# Patient Record
Sex: Female | Born: 1995 | Race: Black or African American | Hispanic: No | Marital: Single | Smoking: Never smoker
Health system: Southern US, Community
[De-identification: ages and names within clinical notes are randomized; demographics above are authoritative.]

## PROBLEM LIST (undated history)

## (undated) ENCOUNTER — Inpatient Hospital Stay (HOSPITAL_COMMUNITY): Payer: Self-pay

---

## 2018-04-14 ENCOUNTER — Inpatient Hospital Stay (HOSPITAL_COMMUNITY): Payer: Managed Care, Other (non HMO)

## 2018-04-14 ENCOUNTER — Inpatient Hospital Stay (HOSPITAL_COMMUNITY)
Admission: AD | Admit: 2018-04-14 | Discharge: 2018-04-14 | Disposition: A | Discharge status: Home / Self Care | Payer: Managed Care, Other (non HMO) | Source: Ambulatory Visit | Attending: Obstetrics & Gynecology | Admitting: Obstetrics & Gynecology

## 2018-04-14 ENCOUNTER — Encounter (HOSPITAL_COMMUNITY): Payer: Self-pay | Admitting: *Deleted

## 2018-04-14 DIAGNOSIS — O26891 Other specified pregnancy related conditions, first trimester: Secondary | ICD-10-CM | POA: Diagnosis not present

## 2018-04-14 DIAGNOSIS — O3680X0 Pregnancy with inconclusive fetal viability, not applicable or unspecified: Secondary | ICD-10-CM | POA: Diagnosis not present

## 2018-04-14 DIAGNOSIS — Z3A01 Less than 8 weeks gestation of pregnancy: Secondary | ICD-10-CM | POA: Insufficient documentation

## 2018-04-14 DIAGNOSIS — R109 Unspecified abdominal pain: Secondary | ICD-10-CM | POA: Diagnosis not present

## 2018-04-14 DIAGNOSIS — O209 Hemorrhage in early pregnancy, unspecified: Secondary | ICD-10-CM

## 2018-04-14 DIAGNOSIS — O26899 Other specified pregnancy related conditions, unspecified trimester: Secondary | ICD-10-CM

## 2018-04-14 LAB — CBC WITH DIFFERENTIAL/PLATELET
Basophils Absolute: 0 10*3/uL (ref 0.0–0.1)
Basophils Relative: 0 %
EOS ABS: 0.1 10*3/uL (ref 0.0–0.5)
EOS PCT: 1 %
HEMATOCRIT: 39.4 % (ref 36.0–46.0)
Hemoglobin: 13.2 g/dL (ref 12.0–15.0)
Lymphocytes Relative: 38 %
Lymphs Abs: 1.9 10*3/uL (ref 0.7–4.0)
MCH: 30.3 pg (ref 26.0–34.0)
MCHC: 33.5 g/dL (ref 30.0–36.0)
MCV: 90.4 fL (ref 80.0–100.0)
MONO ABS: 0.3 10*3/uL (ref 0.1–1.0)
MONOS PCT: 6 %
NEUTROS ABS: 2.9 10*3/uL (ref 1.7–7.7)
Neutrophils Relative %: 55 %
Platelets: 217 10*3/uL (ref 150–400)
RBC: 4.36 MIL/uL (ref 3.87–5.11)
RDW: 12.4 % (ref 11.5–15.5)
WBC: 5.2 10*3/uL (ref 4.0–10.5)
nRBC: 0 % (ref 0.0–0.2)

## 2018-04-14 LAB — HCG, QUANTITATIVE, PREGNANCY: hCG, Beta Chain, Quant, S: 261 m[IU]/mL — ABNORMAL HIGH (ref <5)

## 2018-04-14 LAB — COMPREHENSIVE METABOLIC PANEL
ALT: 11 U/L (ref 0–44)
ANION GAP: 8 (ref 5–15)
AST: 18 U/L (ref 15–41)
Albumin: 4.2 g/dL (ref 3.5–5.0)
Alkaline Phosphatase: 49 U/L (ref 38–126)
BILIRUBIN TOTAL: 1.2 mg/dL (ref 0.3–1.2)
BUN: 7 mg/dL (ref 6–20)
CO2: 25 mmol/L (ref 22–32)
Calcium: 9.1 mg/dL (ref 8.9–10.3)
Chloride: 106 mmol/L (ref 98–111)
Creatinine, Ser: 0.66 mg/dL (ref 0.44–1.00)
GFR calc Af Amer: >60 mL/min (ref >60)
GFR calc non Af Amer: >60 mL/min (ref >60)
GLUCOSE: 83 mg/dL (ref 70–99)
POTASSIUM: 3.7 mmol/L (ref 3.5–5.1)
Sodium: 139 mmol/L (ref 135–145)
TOTAL PROTEIN: 7.4 g/dL (ref 6.5–8.1)

## 2018-04-14 LAB — URINALYSIS, ROUTINE W REFLEX MICROSCOPIC
BACTERIA UA: NONE SEEN
BILIRUBIN URINE: NEGATIVE
Glucose, UA: NEGATIVE mg/dL
KETONES UR: NEGATIVE mg/dL
LEUKOCYTES UA: NEGATIVE
Nitrite: NEGATIVE
PROTEIN: 100 mg/dL — AB
RBC / HPF: >50 RBC/hpf — ABNORMAL HIGH (ref 0–5)
Specific Gravity, Urine: 1.021 (ref 1.005–1.030)
pH: 6 (ref 5.0–8.0)

## 2018-04-14 LAB — ABO/RH: ABO/RH(D): O POS

## 2018-04-14 LAB — POCT PREGNANCY, URINE: PREG TEST UR: POSITIVE — AB

## 2018-04-14 NOTE — MAU Provider Note (Signed)
History     CSN: [id number]  Arrival date and time: 04/14/18 1252   First Provider Initiated Contact with Patient 04/14/18 1609      Chief Complaint  Patient presents with  . Possible Pregnancy  . Abdominal Pain  . Vaginal Bleeding  . Nausea   HPI   Karen Hayes is a 22 y.o. female G2P0010 @ 72w0dhere in MAU with complaints of LLQ abdominal pain and vaginal bleeding. Says the bleeding started over the weekend and it was period like. [facility] and that it was too early to see anything on [place] She was told to F/U today. Says her bleeding became heavy last night and she was passing clots. Says that her pain is in the LLQ and it comes in waves. Says it is minor pain and she has not taken any medication for.   OB History    Gravida  2   Para      Term      Preterm      AB  1   Living        SAB      TAB  1   Ectopic      Multiple      Live Births              History reviewed. No pertinent past medical history.  History reviewed. No pertinent surgical history.  History reviewed. No pertinent family history.  Social History   Tobacco Use  . Smoking status: Never Smoker  . Smokeless tobacco: Never Used  Substance Use Topics  . Alcohol use: Not Currently  . Drug use: Not Currently    Allergies:  Allergies  Allergen Reactions  . Hydrocodone Nausea And Vomiting    No medications prior to admission.   Results for orders placed or performed during the hospital encounter of 04/14/18 (from the past 48 hour(s))  Urinalysis, Routine w reflex microscopic     Status: Abnormal   Collection Time: 04/14/18  1:35 PM  Result Value Ref Range   Color, Urine YELLOW YELLOW   APPearance HAZY (A) CLEAR   Specific Gravity, Urine 1.021 1.005 - 1.030   pH 6.0 5.0 - 8.0   Glucose, UA NEGATIVE NEGATIVE mg/dL   Hgb urine dipstick LARGE (A) NEGATIVE   Bilirubin Urine NEGATIVE NEGATIVE   Ketones,  ur NEGATIVE NEGATIVE mg/dL   Protein, ur 100 (A) NEGATIVE mg/dL   Nitrite NEGATIVE NEGATIVE   Leukocytes, UA NEGATIVE NEGATIVE   RBC / HPF >50 (H) 0 - 5 RBC/hpf   WBC, UA 11-20 0 - 5 WBC/hpf   Bacteria, UA NONE SEEN NONE SEEN   Squamous Epithelial / LPF 0-5 0 - 5   Mucus PRESENT     Comment: [facility] [address] [place] [address] Pregnancy, urine POC     Status: Abnormal   Collection Time: 04/14/18  1:37 PM  Result Value Ref Range   Preg Test, Ur POSITIVE (A) NEGATIVE    Comment:        THE SENSITIVITY OF THIS METHODOLOGY IS >24 mIU/mL   CBC with Differential/Platelet     Status: None   Collection Time: 04/14/18  2:23 PM  Result Value Ref Range   WBC 5.2 4.0 - 10.5 K/uL   RBC 4.36 3.87 - 5.11 MIL/uL   Hemoglobin 13.2 12.0 - 15.0 g/dL   HCT 39.4 36.0 -  46.0 %   MCV 90.4 80.0 - 100.0 fL   MCH 30.3 26.0 - 34.0 pg   MCHC 33.5 30.0 - 36.0 g/dL   RDW 12.4 11.5 - 15.5 %   Platelets 217 150 - 400 K/uL   nRBC 0.0 0.0 - 0.2 %   Neutrophils Relative % 55 %   Neutro Abs 2.9 1.7 - 7.7 K/uL   Lymphocytes Relative 38 %   Lymphs Abs 1.9 0.7 - 4.0 K/uL   Monocytes Relative 6 %   Monocytes Absolute 0.3 0.1 - 1.0 K/uL   Eosinophils Relative 1 %   Eosinophils Absolute 0.1 0.0 - 0.5 K/uL   Basophils Relative 0 %   Basophils Absolute 0.0 0.0 - 0.1 K/uL    Comment: Performed at [facility], [address], [place], [place] [address]  Comprehensive metabolic panel     Status: None   Collection Time: 04/14/18  2:23 PM  Result Value Ref Range   Sodium 139 135 - 145 mmol/L   Potassium 3.7 3.5 - 5.1 mmol/L   Chloride 106 98 - 111 mmol/L   CO2 25 22 - 32 mmol/L   Glucose, Bld 83 70 - 99 mg/dL   BUN 7 6 - 20 mg/dL   Creatinine, Ser 0.66 0.44 - 1.00 mg/dL   Calcium 9.1 8.9 - 10.3 mg/dL   Total Protein 7.4 6.5 - 8.1 g/dL   Albumin 4.2 3.5 - 5.0 g/dL   AST 18 15 - 41 U/L   ALT 11 0 - 44 U/L   Alkaline Phosphatase 49 38 - 126 U/L   Total Bilirubin 1.2  0.3 - 1.2 mg/dL   GFR calc non Af Amer >60 >60 mL/min   GFR calc Af Amer >60 >60 mL/min    Comment: (NOTE) The eGFR has been calculated using the CKD EPI equation. This calculation has not been validated in all clinical situations. eGFR's persistently <60 mL/min signify possible Chronic Kidney Disease.    Anion gap 8 5 - 15    Comment: Performed at [facility], [address] [place], [place] [address]  ABO/Rh     Status: None (Preliminary result)   Collection Time: 04/14/18  2:23 PM  Result Value Ref Range   ABO/RH(D)      O POS Performed at [facility], [address], [place], [place] [address]   hCG, quantitative, pregnancy     Status: Abnormal   Collection Time: 04/14/18  2:23 PM  Result Value Ref Range   hCG, Beta Chain, Quant, S 261 (H) <5 mIU/mL    Comment:          GEST. AGE      CONC.  (mIU/mL)   <=1 WEEK        5 - 50     2 WEEKS       50 - 500     3 WEEKS       100 - 10,000     4 WEEKS     1,000 - 30,000     5 WEEKS     3,500 - 115,000   6-8 WEEKS     12,000 - 270,000    12 WEEKS     15,000 - 220,000        FEMALE AND NON-PREGNANT FEMALE:     LESS THAN 5 mIU/mL Performed at [facility], [address], [place], [place] [address]    US Ob Less Than 14 Weeks With Ob Transvaginal  Result Date: 04/14/2018  CLINICAL DATA:  Vaginal bleeding in 1st trimester pregnancy. Gestational age by LMP of 7 weeks 0 days. EXAM: OBSTETRIC <14 WK [place] AND TRANSVAGINAL OB US TECHNIQUE: Both transabdominal and transvaginal ultrasound examinations were performed for complete evaluation of the gestation as well as the maternal uterus, adnexal regions, and pelvic cul-de-sac. Transvaginal technique was performed to assess early pregnancy. COMPARISON:  None. FINDINGS: Intrauterine gestational sac: None Maternal uterus/adnexae: Normal appearance of both ovaries. A small hypoechoic area is seen in the left adnexa adjacent to the ovary which measures 2.0 x 0.7 x 0.9 cm. Ectopic  pregnancy cannot be excluded. No abnormal free fluid identified. IMPRESSION: No IUP visualized. 1 x 2 cm hypoechoic area noted adjacent to left ovary. This has nonspecific features, and ectopic pregnancy cannot be excluded. No evidence of hemoperitoneum. Electronically Signed   By: Earle Gell M.D.   On: 04/14/2018 15:42   Review of Systems  Constitutional: Negative for fever.  Gastrointestinal: Positive for abdominal pain and nausea.  Genitourinary: Positive for vaginal bleeding and vaginal discharge.   Physical Exam   Blood pressure 110/64, pulse (!) 110, temperature 98.4 F (36.9 C), temperature source Oral, resp. rate 15, height _0  (1.651 m), weight 54 kg, last menstrual period 02/24/2018, SpO2 99 %.  Physical Exam  Constitutional: She is oriented to person, place, and time. She appears well-developed and well-nourished. No distress.  HENT:  Head: Normocephalic.  Eyes: Pupils are equal, round, and reactive to light.  GI: Soft. She exhibits no distension and no mass. There is no tenderness. There is no rebound and no guarding.  Neurological: She is alert and oriented to person, place, and time.  Skin: Skin is warm. She is not diaphoretic.  Psychiatric: Her behavior is normal.   MAU Course  Procedures  None  MDM   HIV, CBC, Hcg, ABO US OB transvaginal  Quant 11/16: 270 @ [facility] today 261 Discussed [place] and labs in detail with the patient. Highly concerning for ectopic however desired pregnancy and patient is stable at this time. [place] is equivocal.  O positive blood type   Assessment and Plan   A:  1. Pregnancy of unknown anatomic location   2. Vaginal bleeding in pregnancy, first trimester   3. Abdominal pain complicating pregnancy     P:  Discharge home in stable condition Return to MAU on Wednesday for stat quant and likely MTX. Discussed this briefly with the patient.  Bleeding precautions Return to MAU sooner if pain worsens Pelvic  rest Ectopic precautions.   Noni Saupe I, NP 04/14/2018 8:04 PM

## 2018-04-14 NOTE — MAU Note (Signed)
Pt reports bleeding since yesterday, some clots yesterday. Lower abd cramping x 2 days, worsening. Nausea. Positive home preg test.

## 2018-04-14 NOTE — Discharge Instructions (Signed)
Abdominal Pain During Pregnancy °Abdominal pain is common in pregnancy. Most of the time, it does not cause harm. There are many causes of abdominal pain. Some causes are more serious than others and sometimes the cause is not known. Abdominal pain can be a sign that something is very wrong with the pregnancy or the pain may have nothing to do with the pregnancy. Always tell your health care provider if you have any abdominal pain. °Follow these instructions at home: °· Do not have sex or put anything in your vagina until your symptoms go away completely. °· Watch your abdominal pain for any changes. °· Get plenty of rest until your pain improves. °· Drink enough fluid to keep your urine clear or pale yellow. °· Take over-the-counter or prescription medicines only as told by your health care provider. °· Keep all follow-up visits as told by your health care provider. This is important. °Contact a health care provider if: °· You have a fever. °· Your pain gets worse or you have cramping. °· Your pain continues after resting. °Get help right away if: °· You are bleeding, leaking fluid, or passing tissue from the vagina. °· You have vomiting or diarrhea that does not go away. °· You have painful or bloody urination. °· You notice a decrease in your baby's movements. °· You feel very weak or faint. °· You have shortness of breath. °· You develop a severe headache with abdominal pain. °· You have abnormal vaginal discharge with abdominal pain. °This information is not intended to replace advice given to you by your health care provider. Make sure you discuss any questions you have with your health care provider. °Document Released: 05/14/2005 Document Revised: 02/23/2016 Document Reviewed: 12/11/2012 °Elsevier Interactive Patient Education © 2018 [employer] ° °Pelvic Rest °Pelvic rest may be recommended if: °· Your placenta is partially or completely covering the opening of your cervix (placenta previa). °· There is  bleeding between the wall of the uterus and the amniotic sac in the first trimester of pregnancy (subchorionic hemorrhage). °· You went into labor too early (preterm labor). ° °Based on your overall health and the health of your baby, your health care provider will decide if pelvic rest is right for you. °How do I rest my pelvis? °For as long as told by your health care provider: °· Do not have sex, sexual stimulation, or an orgasm. °· Do not use tampons. Do not douche. Do not put anything in your vagina. °· Do not lift anything that is heavier than 10 lb (4.5 kg). °· Avoid activities that take a lot of effort (are strenuous). °· Avoid any activity in which your pelvic muscles could become strained. ° °When should I seek medical care? °Seek medical care if you have: °· Cramping pain in your lower abdomen. °· Vaginal discharge. °· A low, dull backache. °· Regular contractions. °· Uterine tightening. ° °When should I seek immediate medical care? °Seek immediate medical care if: °· You have vaginal bleeding and you are pregnant. ° °This information is not intended to replace advice given to you by your health care provider. Make sure you discuss any questions you have with your health care provider. °Document Released: 09/08/2010 Document Revised: 10/20/2015 Document Reviewed: 11/15/2014 °Elsevier Interactive Patient Education © 2018 [employer] ° °

## 2018-04-16 ENCOUNTER — Inpatient Hospital Stay (HOSPITAL_COMMUNITY)
Admission: AD | Admit: 2018-04-16 | Discharge: 2018-04-16 | Disposition: A | Discharge status: Home / Self Care | Payer: Managed Care, Other (non HMO) | Attending: Obstetrics & Gynecology | Admitting: Obstetrics & Gynecology

## 2018-04-16 ENCOUNTER — Ambulatory Visit (INDEPENDENT_AMBULATORY_CARE_PROVIDER_SITE_OTHER): Payer: Managed Care, Other (non HMO) | Admitting: *Deleted

## 2018-04-16 DIAGNOSIS — O3680X0 Pregnancy with inconclusive fetal viability, not applicable or unspecified: Secondary | ICD-10-CM

## 2018-04-16 LAB — HCG, QUANTITATIVE, PREGNANCY: hCG, Beta Chain, Quant, S: 42 m[IU]/mL — ABNORMAL HIGH (ref <5)

## 2018-04-16 NOTE — Progress Notes (Signed)
Pt here today for STAT beta lab.  Pt reports that she is having some scant bleeding that she wears a pad for however changes it every two hours for comfort.  Pt denies any pain today.  Pt informed that she will be here for two hours which she will then get results with f/u.  Pt stated understanding.

## 2018-04-16 NOTE — Progress Notes (Signed)
Results returned and reviewed with Dr. Shawnie PonsPratt. Informed patient Karen Hayes dropped and appears to be miscarrige. Support given,patient states she felt like that is what it was. ADvised her we recommend non stat Karen Hayes weekly until normal and see a provider in 2 weeks for follow up. She voices understanding.  Ardis HughsL. Zeyfang, RN

## 2018-04-17 NOTE — Progress Notes (Signed)
Patient seen and assessed by nursing staff.  Agree with documentation and plan.  

## 2018-04-21 ENCOUNTER — Other Ambulatory Visit: Payer: Self-pay | Admitting: *Deleted

## 2018-04-21 DIAGNOSIS — O039 Complete or unspecified spontaneous abortion without complication: Secondary | ICD-10-CM

## 2018-04-23 ENCOUNTER — Other Ambulatory Visit: Payer: Managed Care, Other (non HMO)

## 2018-04-23 DIAGNOSIS — O039 Complete or unspecified spontaneous abortion without complication: Secondary | ICD-10-CM

## 2018-04-24 LAB — BETA HCG QUANT (REF LAB): HCG QUANT: 2 m[IU]/mL

## 2019-02-23 ENCOUNTER — Encounter (HOSPITAL_COMMUNITY): Payer: Self-pay

## 2019-11-18 IMAGING — US US OB < 14 WEEKS - US OB TV
2 series · 15 of 28 positions shown · non-contrast
Comparison: None.

CLINICAL DATA: Vaginal bleeding in 1st trimester pregnancy.
Gestational age by LMP of 7 weeks 0 days.

EXAM:
OBSTETRIC <14 WK US AND TRANSVAGINAL OB US
TECHNIQUE: Both transabdominal and transvaginal ultrasound examinations were
performed for complete evaluation of the gestation as well as the
maternal uterus, adnexal regions, and pelvic cul-de-sac.
Transvaginal technique was performed to assess early pregnancy.

[Series 1: us ob < 14 weeks - us ob tv · 9 of 36 slices shown (1 of 2)]
[im 1/36]
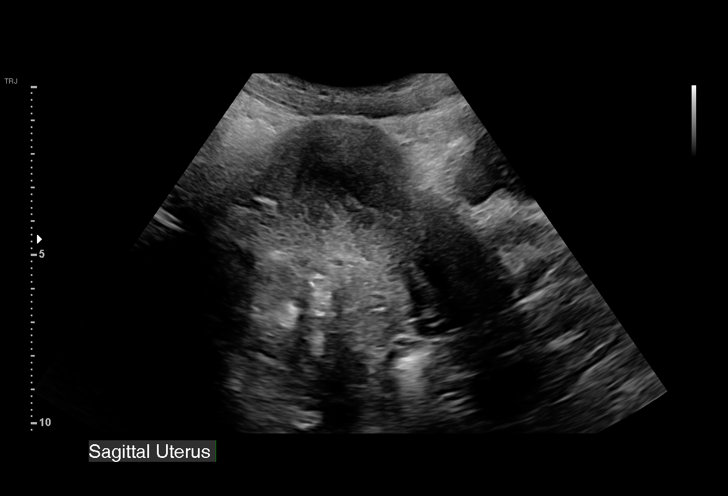
[im 5/36]
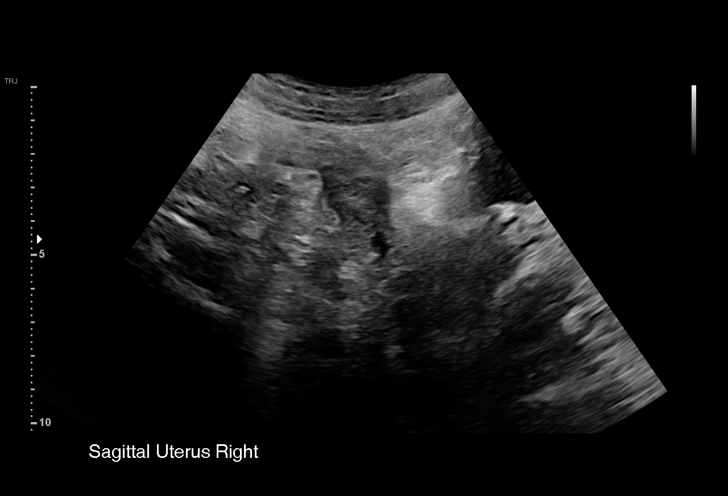
[im 10/36]
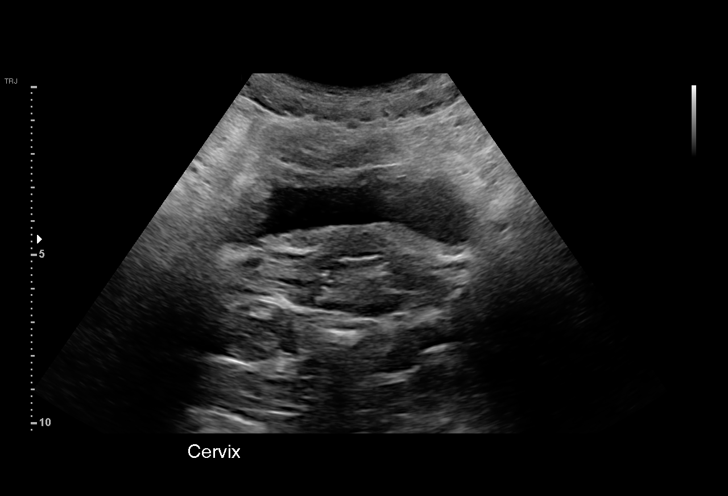
[im 15/36]
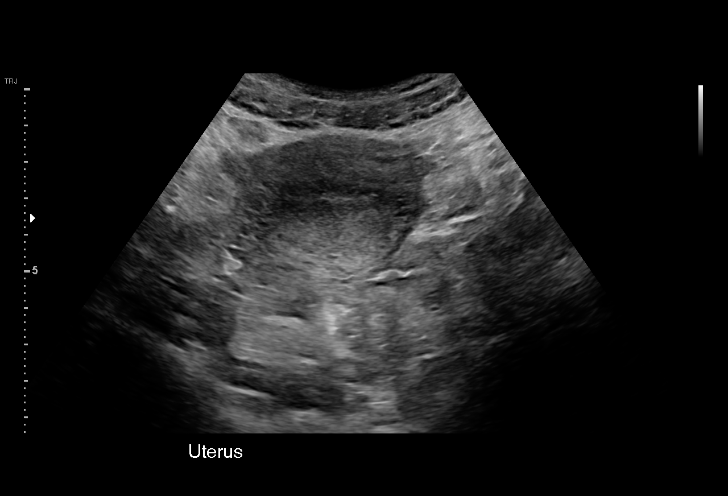
[im 19/36]
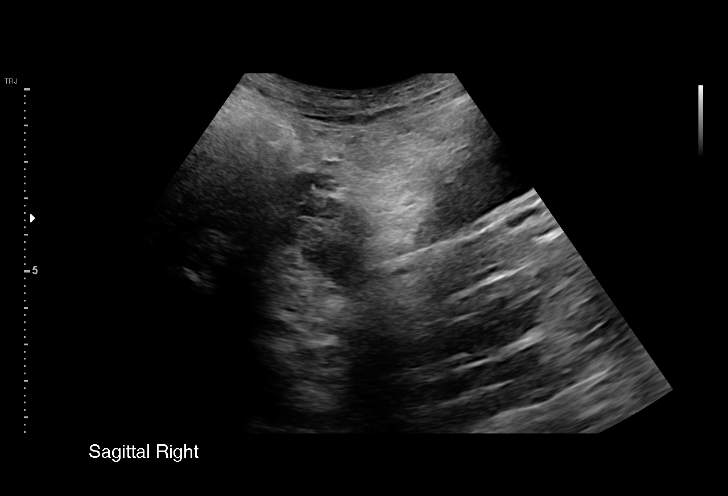
[im 24/36]
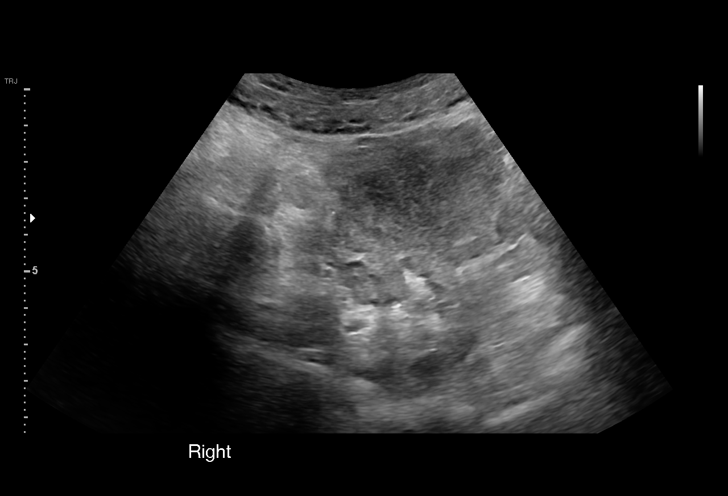
[im 29/36]
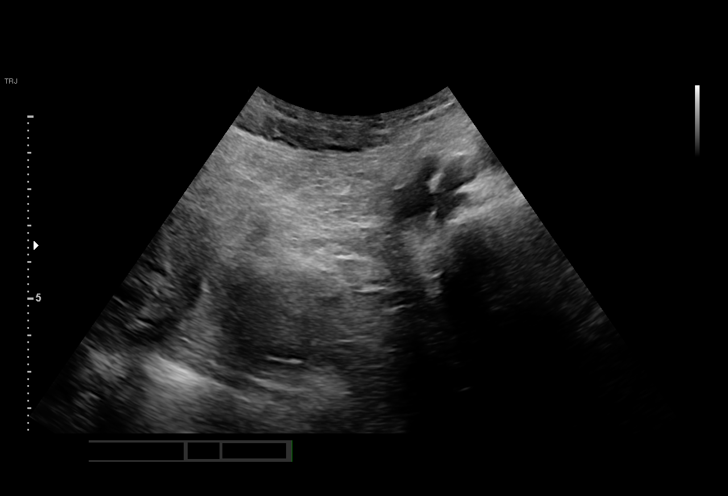
[im 33/36]
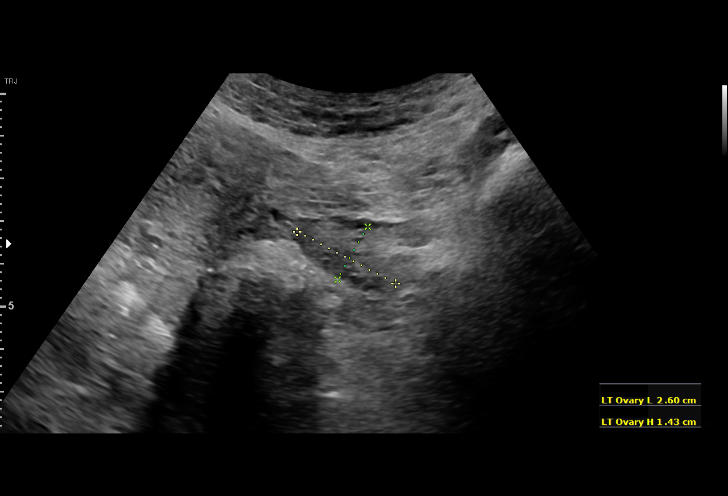
[im 36/36]
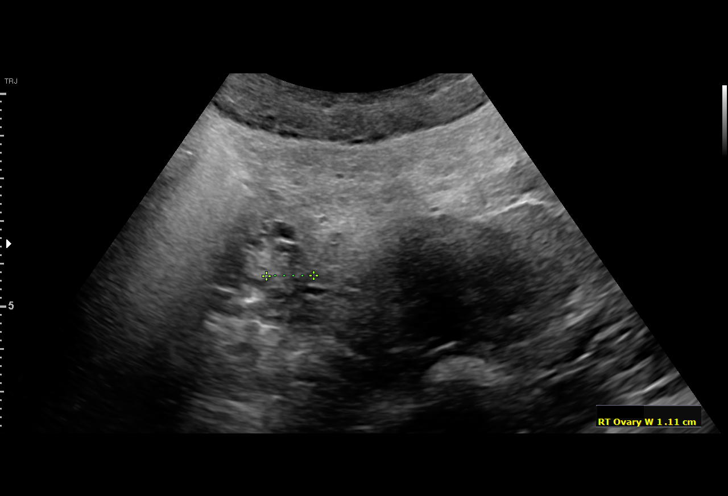

[Series 2: us ob < 14 weeks - us ob tv · 26 acquisitions, 6 frames shown (2 of 2)]
[im 3/26]
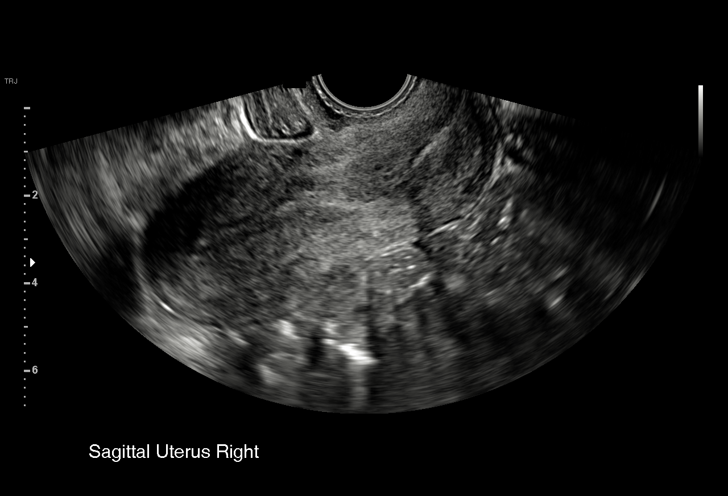
[im 7/26]
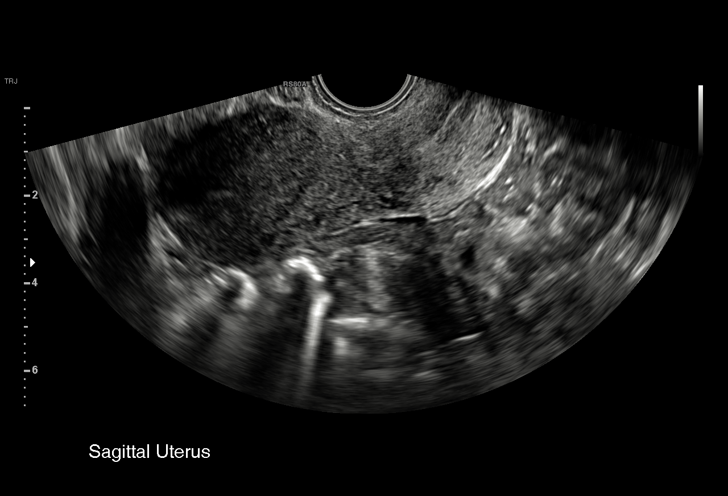
[im 12/26]
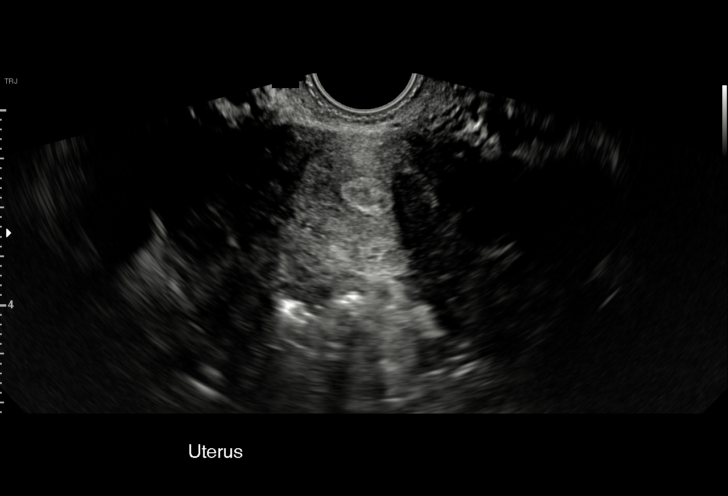
[im 16/26]
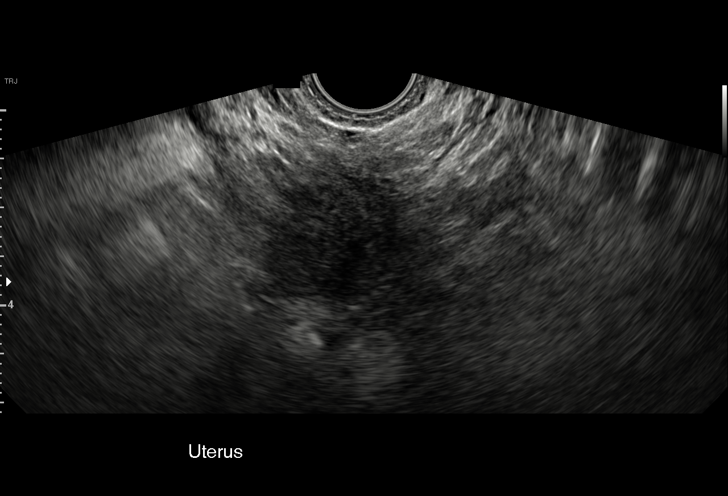
[im 21/26]
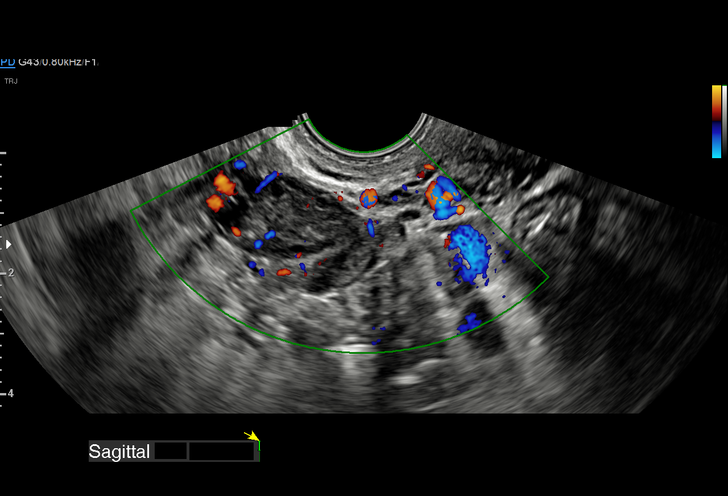
[im 26/26]
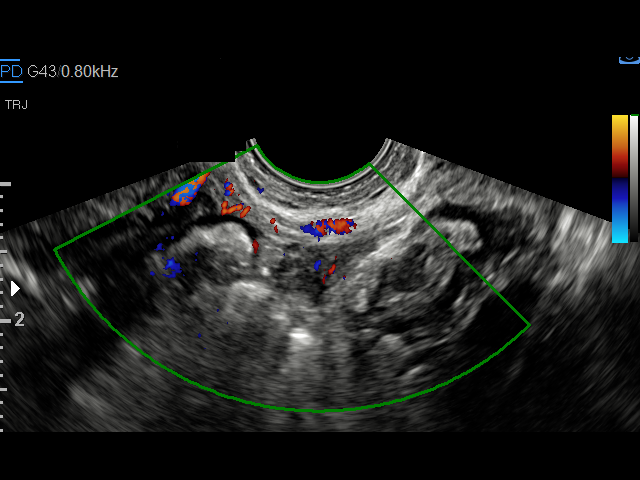

[15 of 28 positions shown; findings below may reference images not displayed]

FINDINGS: Intrauterine gestational sac: None

Maternal uterus/adnexae: Normal appearance of both ovaries. A small
hypoechoic area is seen in the left adnexa adjacent to the ovary
which measures 2.0 x 0.7 x 0.9 cm. Ectopic pregnancy cannot be
excluded. No abnormal free fluid identified.
IMPRESSION: No IUP visualized. 1 x 2 cm hypoechoic area noted adjacent to left
ovary. This has nonspecific features, and ectopic pregnancy cannot
be excluded.

No evidence of hemoperitoneum.
